# Patient Record
Sex: Male | Born: 1970 | Race: White | Hispanic: No | Marital: Married | Smoking: Never smoker
Health system: Southern US, Community
[De-identification: ages and names within clinical notes are randomized; demographics above are authoritative.]

## PROBLEM LIST (undated history)

## (undated) DIAGNOSIS — N419 Inflammatory disease of prostate, unspecified: Secondary | ICD-10-CM

## (undated) DIAGNOSIS — IMO0001 Reserved for inherently not codable concepts without codable children: Secondary | ICD-10-CM

## (undated) DIAGNOSIS — R030 Elevated blood-pressure reading, without diagnosis of hypertension: Secondary | ICD-10-CM

## (undated) DIAGNOSIS — K2270 Barrett's esophagus without dysplasia: Secondary | ICD-10-CM

## (undated) DIAGNOSIS — K649 Unspecified hemorrhoids: Secondary | ICD-10-CM

## (undated) HISTORY — PX: COLONOSCOPY: SHX174

## (undated) HISTORY — PX: APPENDECTOMY: SHX54

---

## 1997-07-30 HISTORY — PX: LUMBAR LAMINECTOMY: SHX95

## 1999-07-31 HISTORY — PX: SHOULDER ARTHROSCOPY: SHX128

## 2004-05-15 ENCOUNTER — Ambulatory Visit: Payer: Self-pay | Admitting: Orthopaedic Surgery

## 2004-06-05 ENCOUNTER — Ambulatory Visit: Payer: Self-pay | Admitting: Orthopaedic Surgery

## 2009-11-14 ENCOUNTER — Encounter: Payer: Self-pay | Admitting: Family Medicine

## 2009-12-07 ENCOUNTER — Encounter: Admission: RE | Admit: 2009-12-07 | Discharge: 2010-02-08 | Payer: Self-pay | Admitting: Internal Medicine

## 2009-12-14 ENCOUNTER — Ambulatory Visit: Payer: Self-pay | Admitting: Sports Medicine

## 2009-12-14 DIAGNOSIS — IMO0002 Reserved for concepts with insufficient information to code with codable children: Secondary | ICD-10-CM

## 2009-12-14 DIAGNOSIS — M5430 Sciatica, unspecified side: Secondary | ICD-10-CM

## 2009-12-14 DIAGNOSIS — M545 Low back pain: Secondary | ICD-10-CM

## 2009-12-19 ENCOUNTER — Encounter: Payer: Self-pay | Admitting: Family Medicine

## 2009-12-19 ENCOUNTER — Ambulatory Visit (HOSPITAL_COMMUNITY): Admission: RE | Admit: 2009-12-19 | Discharge: 2009-12-19 | Payer: Self-pay | Admitting: Family Medicine

## 2010-01-04 ENCOUNTER — Ambulatory Visit: Payer: Self-pay | Admitting: Sports Medicine

## 2010-01-17 ENCOUNTER — Encounter: Admission: RE | Admit: 2010-01-17 | Discharge: 2010-01-17 | Payer: Self-pay | Admitting: Neurosurgery

## 2010-02-02 ENCOUNTER — Encounter: Admission: RE | Admit: 2010-02-02 | Discharge: 2010-02-02 | Payer: Self-pay | Admitting: Neurosurgery

## 2010-08-29 NOTE — Assessment & Plan Note (Signed)
Summary: NP,WC,R KNEE INJURY,LOWER BACK PAIN,MC   Vital Signs:  Patient profile:   40 year old male Height:      74 inches Weight:      226 pounds BMI:     29.12 BP sitting:   132 / 86  Vitals Entered By: Lillia Pauls CMA (Dec 14, 2009 2:31 PM)  History of Present Illness: DOI: 11/14/09  Reports to address lower back pain and right knee pain. Problem started  during a slipping incident while performing work duties.  Mr. Mangold did not fall but he felt as if he twisted and strained his right knee while maintaining his balance.  Did not experience knee tearing, popping, or locking sensation. No knee swelling. Knee pain significantly decreased in the interim. No knee buckling, popping, catching, or locking. No past right knee injuries or procedures.  Mr. Janczak injured his lower back during this incident as well.  Avg pain severity moderate to high. Pain worst on certain positions such as extension. Pain decreased on  position change. Overally, pain is decreasing. However, he notes posterior RLE radiculopathy since the incident, which is increasing in frequency.   He notes intermittent sciatic distribution pain without back pain; no moderating factors; inc frequency. Other ROS  negative for saddle anesthesia, bowel incontinence, or bladder incontinence. Taking no medications for pain. Started formal physical therapy.  No recent back or knee imaging.  History of traumatic L5 disc rupture, s/p surgery  ~12 yrs ago. Faired well post-operatively. No back pain, radiculopathy, or sciatic distribution pain in years leading up to aforementioned workplace incident.  Currently on restricted work duty. No problems performing current work duties. Would like to return to full duty as soon as possible but he is concerned about his back pain and sciatica.   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  Allergies (verified): No Known Drug Allergies  Past History:  Past  Surgical History: L5 Laminectomy after L5 Disc rupture 12 yrs ago PMH-FH-SH reviewed for relevance  Physical Exam  General:  Well-developed,well-nourished,in no acute distress; alert,appropriate and cooperative throughout examination Msk:  BACK: No spinal ttp. No apparent bony deformity. FROM at the waist. Pain on active extension and rightward motions. TTP of the right SI and para-lumbar musculature. No  spasm.  KNEES: No swelling. No discoloration or increased warmth. No ttp of the knee. No ttp of pes bursa. Full ROM. Full strength. Negative McMurray's. Normal ligament laxity.  ANKLES/TOES: Full ROM. Full strength.  GAIT: Non-antalgic. Pulses:  2+ pt/dp pulses. Neurologic:  (+) sitting SLR on right. 2+ DTR throughout the lower extremities. No foot drop.   Impression & Recommendations:  Problem # 1:  BACK PAIN, LUMBAR (ICD-724.2)  - MRI LS-Spine his surgical history, sciatic pain after the 4/18/11workplace incident, and clinical findings. - Continue formal physical therapy as tolerated. - Back brace dispensed for use during work functions. - OTC ibuproffen per package instructions prn pain. - Continue restricted duty. - RTC after MRI to determine further mgmt. Will call Mr. Vanderlinde with MRI results.   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  Problem # 3:  KNEE SPRAIN, RIGHT (ICD-844.9) Assessment: Improved  - Will conservatively monitor given significant interim improvement and clinical findings. - RTC after performance of LS-Spine MRI.  Patient Instructions: 1)  MRI SCHD FOR 8AM ON MAY 23RD AT [facility]

## 2010-08-29 NOTE — Consult Note (Signed)
Summary: [facility]  [facility]   Imported By: Marily Memos 12/14/2009 15:18:36  _____________________________________________________________________  External Attachment:    Type:   Image     Comment:   External Document

## 2010-08-29 NOTE — Assessment & Plan Note (Signed)
Summary: F/U,MC   Vital Signs:  Patient profile:   40 year old male BP sitting:   135 / 92  Vitals Entered By: Lillia Pauls CMA (January 04, 2010 9:09 AM)  History of Present Illness: Reports to f/u LS-Spine MRI. Increased frequency of RLE radiculopathy since LOV. Now having symptoms after prolonged sitting as well. Otherwise no change in ROS. Back brace did not help to relieve symptoms. Has already requested a neurosurgical consult through occupational medicine.  Allergies: No Known Drug Allergies PMH-FH-SH reviewed for relevance  Physical Exam  General:  Well-developed,well-nourished,in no acute distress; alert,appropriate and cooperative throughout examination Msk:  Non-antalgic gait. Neurologic:  No change from last exam. Additional Exam:  Lumbar MRI films viewed during this encounter. Findings are as dictated in the radiology report.   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 Images and findings reviewed with Jesse Velez.  - Referred for neurosurgical consult. - Start tramadol. - Start mobic. - Continue current work restrictions. - RTC in 3 wks.  His updated medication list for this problem includes:    Tramadol Hcl 50 Mg Tabs (Tramadol hcl) .Marland Kitchen... 1 tab by mouth q 6 hrs as needed for pain    Mobic 15 Mg Tabs (Meloxicam) .Marland Kitchen... 1 tab by mouth with food daily  Complete Medication List: 1)  Tramadol Hcl 50 Mg Tabs (Tramadol hcl) .Marland Kitchen.. 1 tab by mouth q 6 hrs as needed for pain 2)  Mobic 15 Mg Tabs (Meloxicam) .Marland Kitchen.. 1 tab by mouth with food daily Prescriptions: MOBIC 15 MG TABS (MELOXICAM) 1 tab by mouth with food daily  #21 x 1   Entered and Authorized by:   Valarie Merino MD   Signed by:   Valarie Merino MD on 01/04/2010   Method used:   Print then Give to Patient   RxID:   [id number] TRAMADOL HCL 50 MG TABS (TRAMADOL HCL) 1 tab by mouth q 6 hrs as needed for  pain  #120 x 1   Entered and Authorized by:   Valarie Merino MD   Signed by:   Valarie Merino MD on 01/04/2010   Method used:   Print then Give to Patient   RxID:   [id number]

## 2011-09-06 ENCOUNTER — Other Ambulatory Visit: Payer: Self-pay | Admitting: Occupational Medicine

## 2011-09-06 ENCOUNTER — Ambulatory Visit: Payer: Self-pay

## 2011-09-06 DIAGNOSIS — R52 Pain, unspecified: Secondary | ICD-10-CM

## 2012-02-26 ENCOUNTER — Ambulatory Visit: Payer: Self-pay | Admitting: Gastroenterology

## 2012-04-15 ENCOUNTER — Ambulatory Visit: Payer: Self-pay | Admitting: Gastroenterology

## 2012-04-15 LAB — KOH PREP

## 2012-04-17 LAB — PATHOLOGY REPORT

## 2013-11-25 IMAGING — RF DG UGI W/O KUB
12 series · 13 of 13 positions shown · non-contrast
Comparison: none

REASON FOR EXAM: Dyspepsia
COMMENTS:

[Series 1: fluoro_barium 2fps_bw · 0.18mm/px · 1 of 1 slices shown (1 of 12)]
[im 1/1]
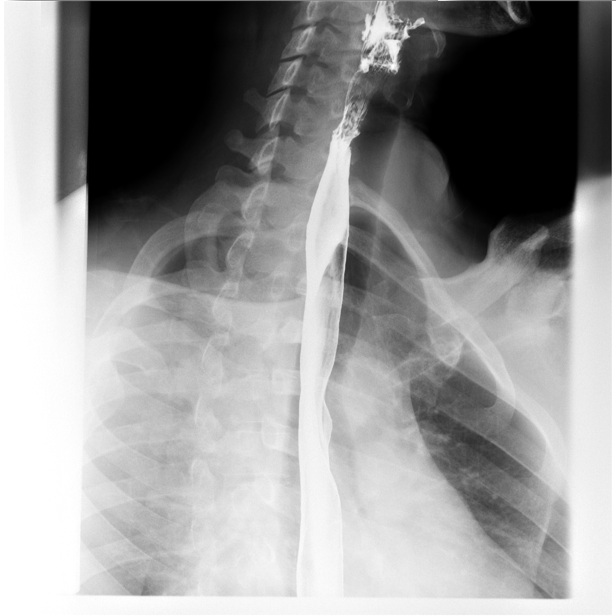

[Series 2: fluoro_barium 2fps_bw · 0.18mm/px · 1 of 1 slices shown (2 of 12)]
[im 1/1]
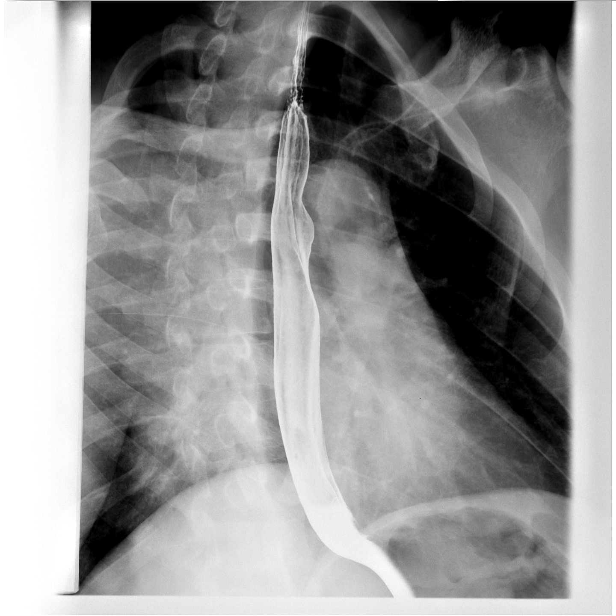

[Series 3: fluoro_barium 2fps_bw · 0.19mm/px · 1 of 1 slices shown (3 of 12)]
[im 1/1]
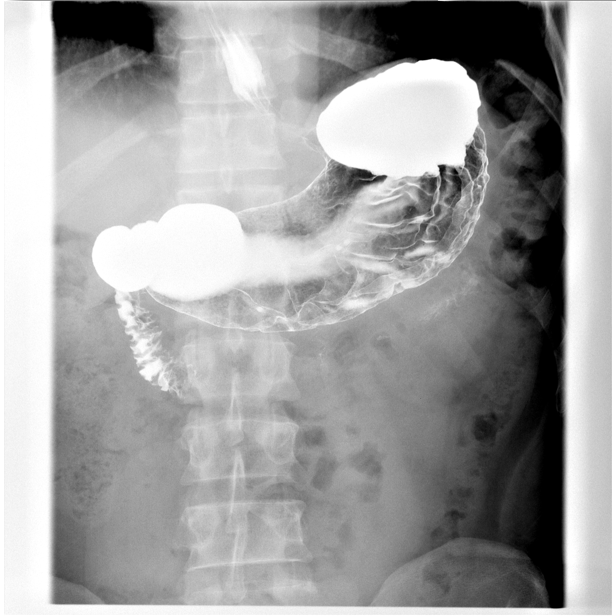

[Series 4: fluoro_barium 2fps_bw · 0.19mm/px · 1 of 1 slices shown (4 of 12)]
[im 1/1]
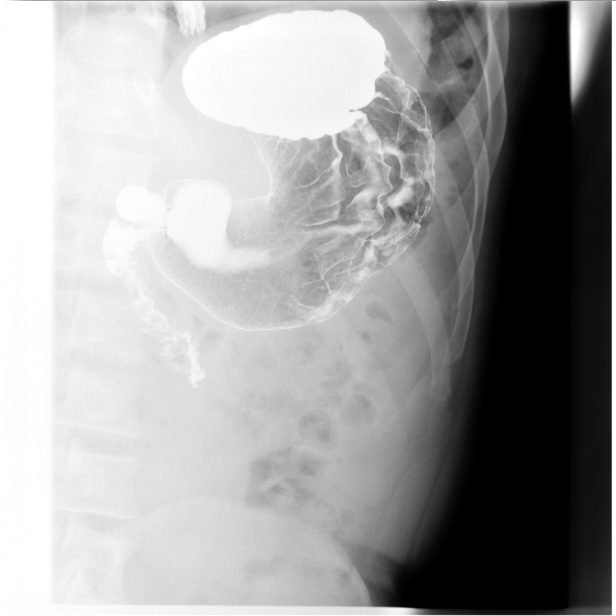

[Series 5: fluoro_barium 2fps_bw · 0.19mm/px · 1 of 1 slices shown (5 of 12)]
[im 1/1]
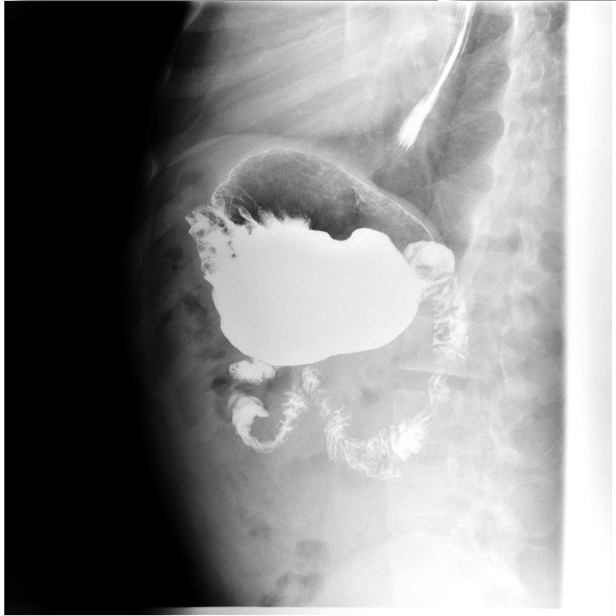

[Series 6: fluoro_barium 2fps_bw · 0.18mm/px · 1 of 1 slices shown (6 of 12)]
[im 1/1]
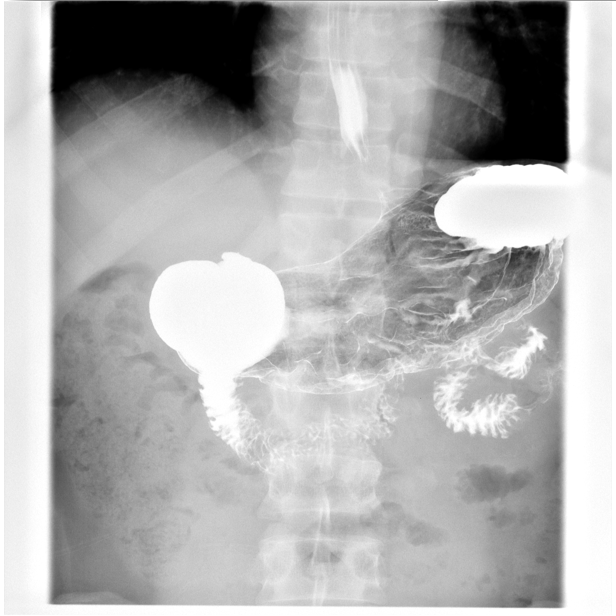

[Series 7: fluoro_barium 2fps_bw · 0.19mm/px · 1 of 1 slices shown (7 of 12)]
[im 1/1]
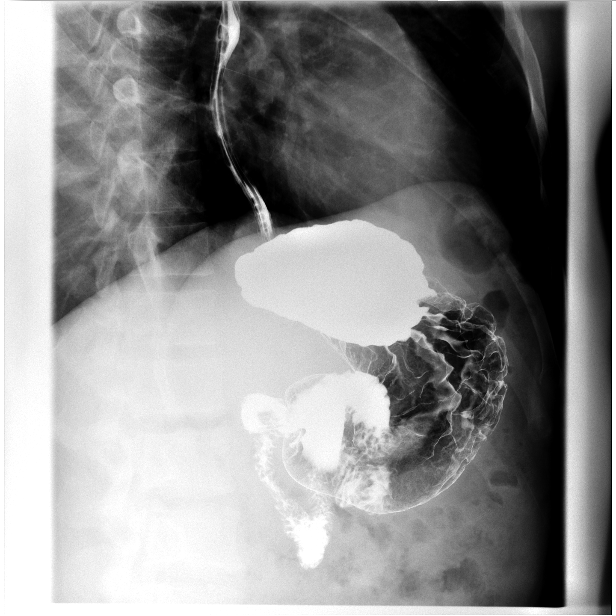

[Series 8: fluoro_barium 2fps_bw · 0.19mm/px · 1 of 1 slices shown (8 of 12)]
[im 1/1]
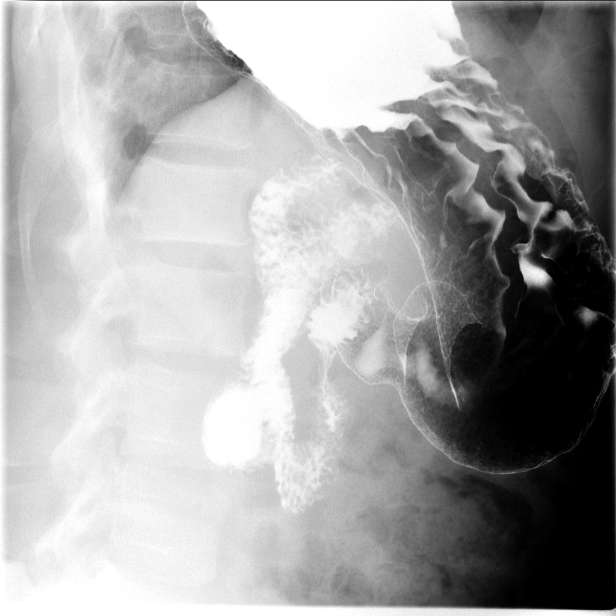

[Series 9: fluoro_barium 2fps_bw · 0.19mm/px · 1 of 1 slices shown (9 of 12)]
[im 1/1]
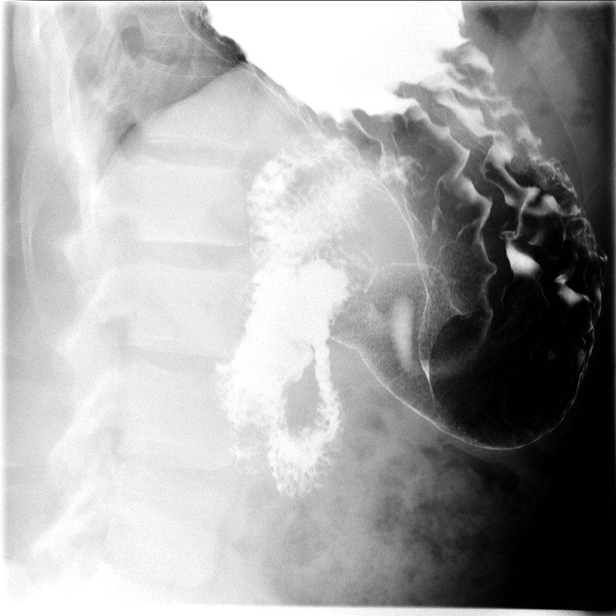

[Series 10: fluoro_barium 2fps_bw · 0.19mm/px · 1 of 1 slices shown (10 of 12)]
[im 1/1]
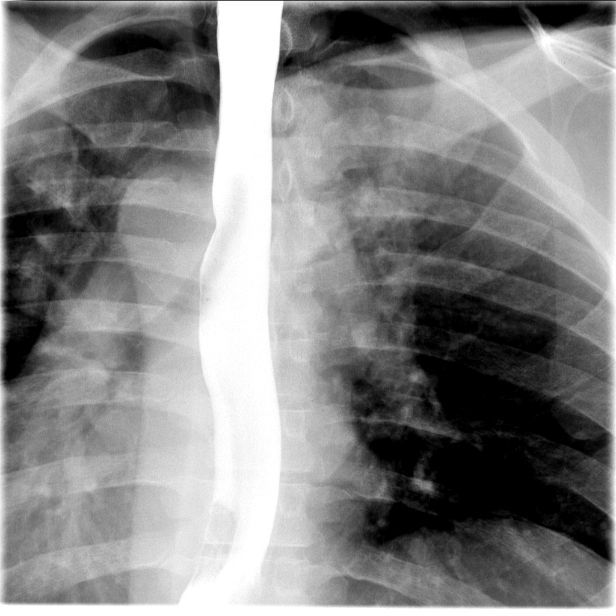

[Series 11: fluoro_barium 2fps_bw · 0.19mm/px · 1 of 1 slices shown (11 of 12)]
[im 1/1]
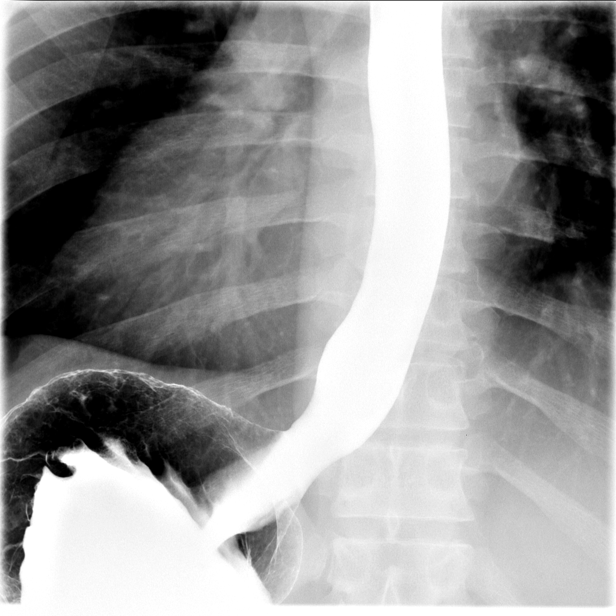

[Series 12: fluoro_barium 2fps_bw · 0.19mm/px · 2 of 2 frames shown (12 of 12)]
[frame 1/2]
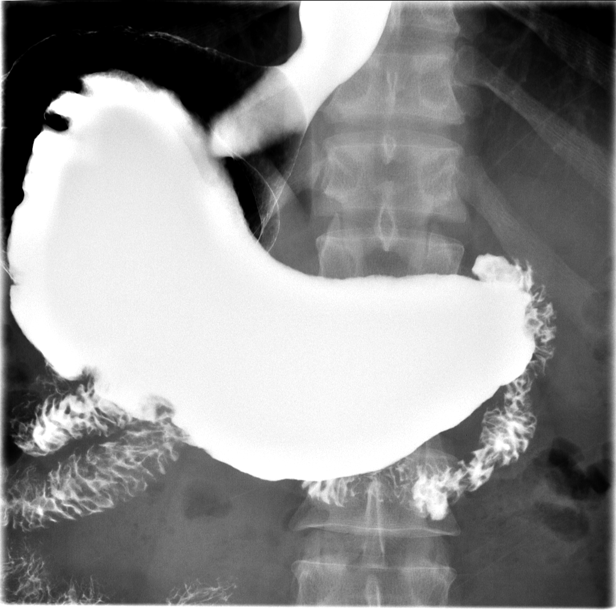
[frame 2/2]
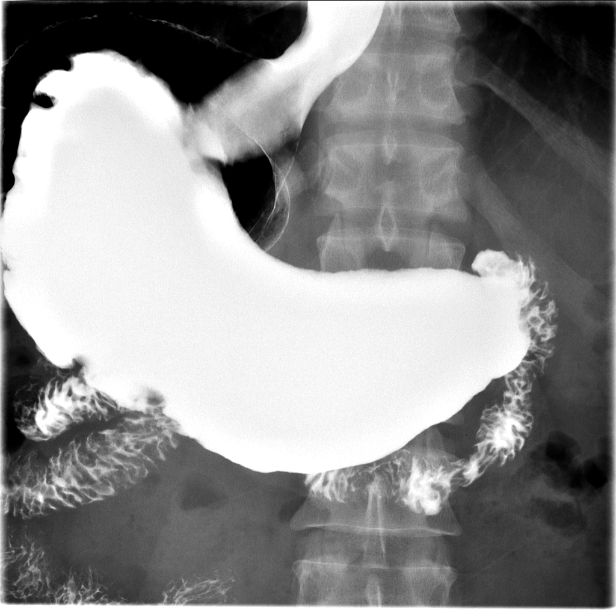

[13 of 13 positions shown; findings below may reference images not displayed]

PROCEDURE:     FL  - FL UPPER GI  - February 26, 2012  [DATE]

RESULT:     Standard air-contrast upper GI obtained. The esophagus is
normal. Mild gastroesophageal reflux noted. Thickened gastric folds are
present throughout the stomach particularly along the greater curvature.
Endoscopic evaluation suggested. The duodenal bulb and C-loop normal.
IMPRESSION: 1. Thickened gastric folds for which endoscopic evaluation suggested.
2. Mild gastroesophageal reflux.

## 2015-11-25 ENCOUNTER — Other Ambulatory Visit: Payer: Self-pay

## 2015-11-25 ENCOUNTER — Encounter: Payer: Self-pay | Admitting: *Deleted

## 2015-11-25 NOTE — Pre-Procedure Instructions (Signed)
I ASKED PT IF DR Katrinka BlazingSMITH HAD INSTRUCTED PT IN ANY TYPE OF PREP FOR HEMORRHOID SURGERY (ENEMA ETC) PT SAID NO AND HE WAS WANDERING THE SAME THING. I TOLD PT TO CALL HIS OFFICE AND ASK TO MAKE SURE HE DID NOT NEED TO DO ANY TYPE OF PREP BEFORE HIS SURGERY. HE VERBALIZED HE WOULD

## 2015-11-25 NOTE — Patient Instructions (Signed)
  Your procedure is scheduled on: 11-29-15  Report to [facility] 2ND FLOOR To find out your arrival time please call [phone] between 1PM - 3PM on 11-28-15  Remember: Instructions that are not followed completely may result in serious medical risk, up to and including death, or upon the discretion of your surgeon and anesthesiologist your surgery may need to be rescheduled.    _X___ 1. Do not eat food or drink liquids after midnight. No gum chewing or hard candies.     _X___ 2. No Alcohol for 24 hours before or after surgery.   ____ 3. Bring all medications with you on the day of surgery if instructed.    ____ 4. Notify your doctor if there is any change in your medical condition     (cold, fever, infections).     Do not wear jewelry, make-up, hairpins, clips or nail polish.  Do not wear lotions, powders, or perfumes. You may wear deodorant.  Do not shave 48 hours prior to surgery. Men may shave face and neck.  Do not bring valuables to the hospital.    [facility] any belongings or valuables.               Contacts, dentures or bridgework may not be worn into surgery.  Leave your suitcase in the car. After surgery it may be brought to your room.  For patients admitted to the hospital, discharge time is determined by your treatment team.   Patients discharged the day of surgery will not be allowed to drive home.   Please read over the following fact sheets that you were given:     ____ Take these medicines the morning of surgery with A SIP OF WATER:    1. NONE  2.   3.   4.  5.  6.  ____ Fleet Enema (as directed)   ____ Use CHG Soap as directed  ____ Use inhalers on the day of surgery  ____ Stop metformin 2 days prior to surgery    ____ Take 1/2 of usual insulin dose the night before surgery and none on the morning of surgery.   ____ Stop Coumadin/Plavix/aspirin-N/A  ____ Stop Anti-inflammatories-NO NSAIDS OR ASA  PRODUCTS-TYLENOL OK TO TAKE   ____ Stop supplements until after surgery.    ____ Bring C-Pap to the hospital.

## 2015-11-29 ENCOUNTER — Ambulatory Visit: Payer: No Typology Code available for payment source | Admitting: Anesthesiology

## 2015-11-29 ENCOUNTER — Encounter: Payer: Self-pay | Admitting: *Deleted

## 2015-11-29 ENCOUNTER — Ambulatory Visit
Admission: RE | Admit: 2015-11-29 | Discharge: 2015-11-29 | Disposition: A | Discharge status: Home / Self Care | Payer: No Typology Code available for payment source | Source: Ambulatory Visit | Attending: Surgery | Admitting: Surgery

## 2015-11-29 ENCOUNTER — Encounter
Admission: RE | Disposition: A | Discharge status: Home / Self Care | Payer: Self-pay | Source: Ambulatory Visit | Attending: Surgery

## 2015-11-29 DIAGNOSIS — Z79899 Other long term (current) drug therapy: Secondary | ICD-10-CM | POA: Diagnosis not present

## 2015-11-29 DIAGNOSIS — K644 Residual hemorrhoidal skin tags: Secondary | ICD-10-CM | POA: Diagnosis present

## 2015-11-29 DIAGNOSIS — Z833 Family history of diabetes mellitus: Secondary | ICD-10-CM | POA: Diagnosis not present

## 2015-11-29 DIAGNOSIS — K641 Second degree hemorrhoids: Secondary | ICD-10-CM | POA: Insufficient documentation

## 2015-11-29 HISTORY — DX: Unspecified hemorrhoids: K64.9

## 2015-11-29 HISTORY — DX: Inflammatory disease of prostate, unspecified: N41.9

## 2015-11-29 HISTORY — DX: Elevated blood-pressure reading, without diagnosis of hypertension: R03.0

## 2015-11-29 HISTORY — DX: Barrett's esophagus without dysplasia: K22.70

## 2015-11-29 HISTORY — PX: HEMORRHOID SURGERY: SHX153

## 2015-11-29 HISTORY — DX: Reserved for inherently not codable concepts without codable children: IMO0001

## 2015-11-29 SURGERY — HEMORRHOIDECTOMY
Anesthesia: General | Wound class: Clean Contaminated

## 2015-11-29 MED ORDER — ACETAMINOPHEN-CODEINE #3 300-30 MG PO TABS
ORAL_TABLET | ORAL | Status: AC
  Administered 2015-11-29: 1 via ORAL
  Filled 2015-11-29: qty 1

## 2015-11-29 MED ORDER — FAMOTIDINE 20 MG PO TABS
20.0000 mg | ORAL_TABLET | Freq: Once | ORAL | Status: AC
  Administered 2015-11-29: 20 mg via ORAL

## 2015-11-29 MED ORDER — FENTANYL CITRATE (PF) 100 MCG/2ML IJ SOLN
INTRAMUSCULAR | Status: AC
  Filled 2015-11-29: qty 2

## 2015-11-29 MED ORDER — ONDANSETRON HCL 4 MG PO TABS: 4.0000 mg | ORAL_TABLET | Freq: Four times a day (QID) | ORAL | Status: AC | PRN

## 2015-11-29 MED ORDER — PROPOFOL 10 MG/ML IV BOLUS
INTRAVENOUS | Status: DC | PRN
  Administered 2015-11-29: 200 mg via INTRAVENOUS

## 2015-11-29 MED ORDER — MIDAZOLAM HCL 2 MG/2ML IJ SOLN
INTRAMUSCULAR | Status: DC | PRN
  Administered 2015-11-29: 2 mg via INTRAVENOUS

## 2015-11-29 MED ORDER — BUPIVACAINE LIPOSOME 1.3 % IJ SUSP
INTRAMUSCULAR | Status: DC | PRN
  Administered 2015-11-29: 20 mL

## 2015-11-29 MED ORDER — ONDANSETRON HCL 4 MG/2ML IJ SOLN
INTRAMUSCULAR | Status: AC
  Filled 2015-11-29: qty 2

## 2015-11-29 MED ORDER — ONDANSETRON HCL 4 MG PO TABS
ORAL_TABLET | ORAL | Status: AC
  Filled 2015-11-29: qty 1

## 2015-11-29 MED ORDER — BUPIVACAINE LIPOSOME 1.3 % IJ SUSP
INTRAMUSCULAR | Status: AC
  Filled 2015-11-29: qty 20

## 2015-11-29 MED ORDER — FAMOTIDINE 20 MG PO TABS
ORAL_TABLET | ORAL | Status: AC
  Administered 2015-11-29: 20 mg via ORAL
  Filled 2015-11-29: qty 1

## 2015-11-29 MED ORDER — ACETAMINOPHEN-CODEINE #3 300-30 MG PO TABS
1.0000 | ORAL_TABLET | ORAL | Status: DC | PRN
  Administered 2015-11-29 (×2): 1 via ORAL

## 2015-11-29 MED ORDER — BUPIVACAINE-EPINEPHRINE (PF) 0.5% -1:200000 IJ SOLN
INTRAMUSCULAR | Status: DC | PRN
  Administered 2015-11-29: 22 mL

## 2015-11-29 MED ORDER — LACTATED RINGERS IV SOLN
INTRAVENOUS | Status: DC
Start: 2015-11-29 — End: 2015-11-29
  Administered 2015-11-29: 07:00:00 via INTRAVENOUS

## 2015-11-29 MED ORDER — ONDANSETRON HCL 4 MG/2ML IJ SOLN
INTRAMUSCULAR | Status: DC | PRN
  Administered 2015-11-29: 4 mg via INTRAVENOUS

## 2015-11-29 MED ORDER — ACETAMINOPHEN-CODEINE #3 300-30 MG PO TABS: 1.0000 | ORAL_TABLET | ORAL | Status: AC | PRN

## 2015-11-29 MED ORDER — FENTANYL CITRATE (PF) 100 MCG/2ML IJ SOLN
25.0000 ug | INTRAMUSCULAR | Status: AC | PRN
  Administered 2015-11-29 (×6): 25 ug via INTRAVENOUS

## 2015-11-29 MED ORDER — FENTANYL CITRATE (PF) 100 MCG/2ML IJ SOLN
INTRAMUSCULAR | Status: DC | PRN
  Administered 2015-11-29: 50 ug via INTRAVENOUS
  Administered 2015-11-29: 25 ug via INTRAVENOUS
  Administered 2015-11-29: 50 ug via INTRAVENOUS
  Administered 2015-11-29: 25 ug via INTRAVENOUS
  Administered 2015-11-29 (×2): 50 ug via INTRAVENOUS

## 2015-11-29 MED ORDER — ONDANSETRON HCL 4 MG PO TABS
4.0000 mg | ORAL_TABLET | Freq: Once | ORAL | Status: AC
  Administered 2015-11-29: 4 mg via ORAL

## 2015-11-29 MED ORDER — HYDROMORPHONE HCL 1 MG/ML IJ SOLN: 0.2500 mg | INTRAMUSCULAR | Status: DC | PRN

## 2015-11-29 MED ORDER — BUPIVACAINE-EPINEPHRINE (PF) 0.5% -1:200000 IJ SOLN
INTRAMUSCULAR | Status: AC
  Filled 2015-11-29: qty 30

## 2015-11-29 MED ORDER — ACETAMINOPHEN-CODEINE #3 300-30 MG PO TABS
ORAL_TABLET | ORAL | Status: AC
  Filled 2015-11-29: qty 1

## 2015-11-29 MED ORDER — LIDOCAINE HCL (CARDIAC) 20 MG/ML IV SOLN
INTRAVENOUS | Status: DC | PRN
Start: 2015-11-29 — End: 2015-11-29
  Administered 2015-11-29: 60 mg via INTRAVENOUS

## 2015-11-29 SURGICAL SUPPLY — 28 items
BLADE SURG 15 STRL LF DISP TIS (BLADE) ×1 IMPLANT
BLADE SURG 15 STRL SS (BLADE) ×2
CANISTER SUCT 1200ML W/VALVE (MISCELLANEOUS) ×3 IMPLANT
DRAPE LAPAROTOMY 100X77 ABD (DRAPES) ×3 IMPLANT
DRAPE LEGGINS SURG 28X43 STRL (DRAPES) ×3 IMPLANT
DRAPE UNDER BUTTOCK W/FLU (DRAPES) ×3 IMPLANT
ELECT REM PT RETURN 9FT ADLT (ELECTROSURGICAL) ×3
ELECTRODE REM PT RTRN 9FT ADLT (ELECTROSURGICAL) ×1 IMPLANT
GAUZE SPONGE 4X4 12PLY STRL (GAUZE/BANDAGES/DRESSINGS) ×3 IMPLANT
GLOVE BIO SURGEON STRL SZ7.5 (GLOVE) ×9 IMPLANT
GOWN STRL REUS W/ TWL LRG LVL3 (GOWN DISPOSABLE) ×2 IMPLANT
GOWN STRL REUS W/TWL LRG LVL3 (GOWN DISPOSABLE) ×4
HARMONIC SCALPEL FOCUS (MISCELLANEOUS) IMPLANT
LABEL OR SOLS (LABEL) ×3 IMPLANT
NEEDLE HYPO 25X1 1.5 SAFETY (NEEDLE) ×6 IMPLANT
NS IRRIG 500ML POUR BTL (IV SOLUTION) ×3 IMPLANT
PACK BASIN MINOR ARMC (MISCELLANEOUS) ×3 IMPLANT
PAD ABD DERMACEA PRESS 5X9 (GAUZE/BANDAGES/DRESSINGS) IMPLANT
PAD PREP 24X41 OB/GYN DISP (PERSONAL CARE ITEMS) ×3 IMPLANT
SOL PREP PVP 2OZ (MISCELLANEOUS) ×6
SOLUTION PREP PVP 2OZ (MISCELLANEOUS) ×2 IMPLANT
STAPLER PROXIMATE HCS (STAPLE) IMPLANT
STRAP SAFETY BODY (MISCELLANEOUS) IMPLANT
SURGILUBE 2OZ TUBE FLIPTOP (MISCELLANEOUS) ×3 IMPLANT
SUT CHROMIC 2 0 SH (SUTURE) ×9 IMPLANT
SUT CHROMIC 3 0 SH 27 (SUTURE) ×6 IMPLANT
SUT PROLENE 3 0 PS 2 (SUTURE) IMPLANT
SYRINGE 10CC LL (SYRINGE) ×6 IMPLANT

## 2015-11-29 NOTE — Transfer of Care (Signed)
Immediate Anesthesia Transfer of Care Note  Patient: Jesse Velez  Procedure(s) Performed: Procedure(s): HEMORRHOIDECTOMY (N/A)  Patient Location: PACU  Anesthesia Type:General  Level of Consciousness: responds to stimulation, sleeping  Airway & Oxygen Therapy: Patient Spontanous Breathing and Patient connected to face mask oxygen  Post-op Assessment: Report given to RN and Post -op Vital signs reviewed and stable  Post vital signs: Reviewed and stable  Last Vitals:  Filed Vitals:   11/29/15 0610 11/29/15 0945  BP: 140/90 147/84  Pulse: 64 80  Temp: 36.6 C 36 C  Resp: 14 20    Last Pain: There were no vitals filed for this visit.       Complications: No apparent anesthesia complications

## 2015-11-29 NOTE — Op Note (Signed)
OPERATIVE REPORT  PREOPERATIVE  DIAGNOSIS: . Hemorrhoids  POSTOPERATIVE DIAGNOSIS: . Hemorrhoids  PROCEDURE: . Hemorrhoidectomy  ANESTHESIA:  General  SURGEON: Renda RollsWilton Angelise Petrich  MD   INDICATIONS: . He has history of large prolapsing hemorrhoids with bleeding and pain and difficulty cleaning. Surgery was recommended for definitive treatment.  With the patient on the operating table in the supine position he was placed under general anesthesia. The legs were elevated into the lithotomy position using ankle straps. The scrotum was retracted towards paper tape. The perianal area was prepared with Betadine solution and draped with sterile towels and sheets  Initial inspection demonstrated multiple internal and external hemorrhoids. The internal hemorrhoids were markedly prolapsed. There were large internal and external hemorrhoid complexes found at the 1:00 and 4:00 and 8:00 and 11:00 positions. The anoderm was infiltrated with half percent Sensorcaine with epinephrine. The anal canal was dilated large enough to admit 4 fingers. The bivalve anal retractor was introduced.  At the 8:00 position a high ligation of the internal hemorrhoid was done with a 2-0 chromic suture ligature. A V-shaped incision was made externally with a scalpel. Dissection and was continued with electrocautery. The internal anal sphincter was identified. The hemorrhoid was dissected up to the previously placed suture ligature and again ligated. Several small bleeding points were cauterized. The wound was closed with running locked tied 2-0 chromic suture leaving a small opening externally for drainage.  A similar procedure was carried out at the 11:00 position with a similar ligation and excision and closure.  A similar procedure was carried out at the 4:00 position with a similar ligation and excision and closure.  A similar procedure was carried out at the 1:00 position with a similar ligation and excision and  closure.  Following this the skin was further prepared with Betadine solution. 20 cc of Exparel were infiltrated into the subcutaneous tissues and into the deeper tissues surrounding the sphincter. Dressings were applied with paper tape  The patient appeared to be in satisfactory condition and was prepared for transfer to the recovery room.   Renda RollsWilton Timmi Devora M.D.

## 2015-11-29 NOTE — Anesthesia Postprocedure Evaluation (Signed)
Anesthesia Post Note  Patient: Jesse Velez  Procedure(s) Performed: Procedure(s) (LRB): HEMORRHOIDECTOMY (N/A)  Patient location during evaluation: PACU Anesthesia Type: General Level of consciousness: awake and alert Pain management: pain level controlled Vital Signs Assessment: post-procedure vital signs reviewed and stable Respiratory status: spontaneous breathing, nonlabored ventilation, respiratory function stable and patient connected to nasal cannula oxygen Cardiovascular status: blood pressure returned to baseline and stable Postop Assessment: no signs of nausea or vomiting Anesthetic complications: no    Last Vitals:  Filed Vitals:   11/29/15 1050 11/29/15 1055  BP:  129/84  Pulse: 72 66  Temp:    Resp: 9 0    Last Pain:  Filed Vitals:   11/29/15 1055  PainSc: 2                  Cleda MccreedyJoseph K Ricky Doan

## 2015-11-29 NOTE — Anesthesia Preprocedure Evaluation (Signed)
Anesthesia Evaluation  Patient identified by MRN, date of birth, ID band Patient awake    Reviewed: Allergy & Precautions, H&P , NPO status , Patient's Chart, lab work & pertinent test results  History of Anesthesia Complications Negative for: history of anesthetic complications  Airway Mallampati: III  TM Distance: >3 FB Neck ROM: full    Dental  (+) Poor Dentition, Chipped   Pulmonary neg pulmonary ROS, neg shortness of breath,    Pulmonary exam normal breath sounds clear to auscultation       Cardiovascular Exercise Tolerance: Good (-) angina(-) Past MI and (-) DOE negative cardio ROS Normal cardiovascular exam Rhythm:regular Rate:Normal     Neuro/Psych  Neuromuscular disease negative psych ROS   GI/Hepatic Neg liver ROS, GERD  Controlled,  Endo/Other  negative endocrine ROS  Renal/GU negative Renal ROS  negative genitourinary   Musculoskeletal   Abdominal   Peds  Hematology negative hematology ROS (+)   Anesthesia Other Findings Past Medical History:   Barrett's esophagus                                          Hemorrhoids                                                  Prostatitis                                                  Elevated BP                                                    Comment:NEVER PLACED ON ANY MEDS PER PT  Past Surgical History:   SHOULDER ARTHROSCOPY                            Left 2001         LUMBAR LAMINECTOMY                               1999         COLONOSCOPY                                                   APPENDECTOMY                                                 BMI    Body Mass Index   28.87 kg/m 2    Signs and symptoms suggestive of sleep apnea    Reproductive/Obstetrics negative OB ROS  Anesthesia Physical Anesthesia Plan  ASA: III  Anesthesia Plan: General LMA   Post-op Pain Management:     Induction:   Airway Management Planned:   Additional Equipment:   Intra-op Plan:   Post-operative Plan:   Informed Consent: I have reviewed the patients History and Physical, chart, labs and discussed the procedure including the risks, benefits and alternatives for the proposed anesthesia with the patient or authorized representative who has indicated his/her understanding and acceptance.   Dental Advisory Given  Plan Discussed with: Anesthesiologist, CRNA and Surgeon  Anesthesia Plan Comments:         Anesthesia Quick Evaluation

## 2015-11-29 NOTE — Discharge Instructions (Addendum)
Take regular strength Tylenol or Tylenol with codeine one or 2 tablets each 4 hours as needed for pain.  Take docusate sodium 100mg  which is an over-the-counter stool softener 1 tablet 3 times per day to help avoid constipation. May decrease frequency of dosing once moving bowels satisfactorily.  Take Zofran 4mg  tablet up to 4 times per day as needed for nausea.  Remove dressing later today or tomorrow. May shower and/or sitting in warm water. Tuck gauze or pad in underwear as needed for drainage.  AMBULATORY SURGERY  DISCHARGE INSTRUCTIONS   1) The drugs that you were given will stay in your system until tomorrow so for the next 24 hours you should not:  A) Drive an automobile B) Make any legal decisions C) Drink any alcoholic beverage   2) You may resume regular meals tomorrow.  Today it is better to start with liquids and gradually work up to solid foods.  You may eat anything you prefer, but it is better to start with liquids, then soup and crackers, and gradually work up to solid foods.   3) Please notify your doctor immediately if you have any unusual bleeding, trouble breathing, redness and pain at the surgery site, drainage, fever, or pain not relieved by medication.    4) Additional Instructions:    Please contact your physician with any problems or Same Day Surgery at [phone], Monday through Friday 6 am to 4 pm, or  at [facility] Main number at [phone].

## 2015-11-29 NOTE — Anesthesia Procedure Notes (Signed)
Procedure Name: LMA Insertion Performed by: Casey BurkittHOANG, Caroleann Casler Pre-anesthesia Checklist: Patient identified, Emergency Drugs available, Patient being monitored, Suction available and Timeout performed Patient Re-evaluated:Patient Re-evaluated prior to inductionOxygen Delivery Method: Circle system utilized Preoxygenation: Pre-oxygenation with 100% oxygen Intubation Type: IV induction LMA: LMA inserted LMA Size: 4.0 Number of attempts: 1 Tube secured with: Tape Dental Injury: Teeth and Oropharynx as per pre-operative assessment  Comments: Preexisting injury to lower lip

## 2015-11-29 NOTE — H&P (Signed)
  He reports no change in condition since office exam  Labs noted  Discussed plan for hemorrhoidectomy 

## 2015-11-30 LAB — SURGICAL PATHOLOGY
